# Patient Record
Sex: Female | Born: 1995 | Race: Black or African American | Hispanic: No | Marital: Married | Smoking: Never smoker
Health system: Southern US, Community
[De-identification: ages and names within clinical notes are randomized; demographics above are authoritative.]

## PROBLEM LIST (undated history)

## (undated) DIAGNOSIS — I10 Essential (primary) hypertension: Secondary | ICD-10-CM

---
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
## 2016-08-15 ENCOUNTER — Encounter (HOSPITAL_COMMUNITY): Payer: Self-pay

## 2016-08-15 ENCOUNTER — Emergency Department (HOSPITAL_COMMUNITY)
Admission: EM | Admit: 2016-08-15 | Discharge: 2016-08-15 | Disposition: A | Discharge status: Home / Self Care | Payer: Medicaid Other | Attending: Emergency Medicine | Admitting: Emergency Medicine

## 2016-08-15 ENCOUNTER — Emergency Department (HOSPITAL_COMMUNITY): Payer: Medicaid Other

## 2016-08-15 DIAGNOSIS — I10 Essential (primary) hypertension: Secondary | ICD-10-CM | POA: Insufficient documentation

## 2016-08-15 DIAGNOSIS — R0789 Other chest pain: Secondary | ICD-10-CM | POA: Diagnosis present

## 2016-08-15 LAB — BASIC METABOLIC PANEL
ANION GAP: 8 (ref 5–15)
BUN: 7 mg/dL (ref 6–20)
CHLORIDE: 102 mmol/L (ref 101–111)
CO2: 26 mmol/L (ref 22–32)
Calcium: 10.1 mg/dL (ref 8.9–10.3)
Creatinine, Ser: 0.9 mg/dL (ref 0.44–1.00)
GFR calc Af Amer: >60 mL/min (ref >60)
GLUCOSE: 91 mg/dL (ref 65–99)
POTASSIUM: 3.8 mmol/L (ref 3.5–5.1)
Sodium: 136 mmol/L (ref 135–145)

## 2016-08-15 LAB — I-STAT TROPONIN, ED
TROPONIN I, POC: 0 ng/mL (ref 0.00–0.08)
Troponin i, poc: 0 ng/mL (ref 0.00–0.08)

## 2016-08-15 LAB — CBC
HCT: 42.1 % (ref 36.0–46.0)
Hemoglobin: 13.9 g/dL (ref 12.0–15.0)
MCH: 28.2 pg (ref 26.0–34.0)
MCHC: 33 g/dL (ref 30.0–36.0)
MCV: 85.4 fL (ref 78.0–100.0)
PLATELETS: 486 10*3/uL — AB (ref 150–400)
RBC: 4.93 MIL/uL (ref 3.87–5.11)
RDW: 12.2 % (ref 11.5–15.5)
WBC: 5.9 10*3/uL (ref 4.0–10.5)

## 2016-08-15 LAB — D-DIMER, QUANTITATIVE: D-Dimer, Quant: 0.28 ug/mL-FEU (ref 0.00–0.50)

## 2016-08-15 LAB — I-STAT BETA HCG BLOOD, ED (MC, WL, AP ONLY): I-stat hCG, quantitative: <5 m[IU]/mL (ref <5)

## 2016-08-15 MED ORDER — IBUPROFEN 600 MG PO TABS: 600.0000 mg | ORAL_TABLET | Freq: Four times a day (QID) | ORAL | 0 refills | Status: AC | PRN

## 2016-08-15 MED ORDER — IBUPROFEN 800 MG PO TABS
800.0000 mg | ORAL_TABLET | Freq: Once | ORAL | Status: AC
  Administered 2016-08-15: 800 mg via ORAL
  Filled 2016-08-15: qty 1

## 2016-08-15 NOTE — Discharge Instructions (Signed)
Your workup today included an EKG, chest xray, and blood work does not show an emergent cause for your chest pain.  It is likely musculoskeletal.  You may take Ibuprofen every 6 hours as needed for pain.  [facility] physician [facility].  Return to the ED if you experience worsening chest pain, shortness of breath, pass out, or any new symptoms.

## 2016-08-15 NOTE — ED Triage Notes (Signed)
Per Pt, Pt is coming from home with new onset of chest discomfort that started this morning. Reports SOB. Denies any other symptoms

## 2016-08-15 NOTE — ED Provider Notes (Signed)
[facility] Provider Note   CSN: [id number] Arrival date & time: 08/15/16  4098     History   Chief Complaint Chief Complaint  Patient presents with  . Chest Pain    HPI Ana Blair is a 20 y.o. female.  HPI   Ana Blair is a 20 y.o. female with PMH significant for HTN on lisinopril who presents with sudden onset, constant, unchanging, central, aching, non-radiating chest pain that began 9 hours ago.  Associated symptoms include SOB.  The pain began when she was lying flat.  Worse when she lies on her right side and better when she lies on her stomach.  No medications prior to arrival.  It's not exertional.  She does not smoke.  No family hx of SCD.  No hx of DVT/PE.  No recent prolonged immobilization or surgery.  No exogenous estrogen use. This has never happened before. Denies recent dietary changes. She denies fever, chills, cough, upper back pain, lightheadedness, dizziness, N/V/D, abdominal pain, or urinary symptoms.  Past Medical History:  Diagnosis Date  . Hypertension     There are no active problems to display for this patient.   History reviewed. No pertinent surgical history.  OB History    No data available       Home Medications    Prior to Admission medications   Medication Sig Start Date End Date Taking? Authorizing Provider  amLODipine (NORVASC) 10 MG tablet Take 10 mg by mouth daily.    Historical Provider, MD  dextromethorphan (DELSYM) 30 MG/5ML liquid Take 10 mLs (60 mg total) by mouth as needed for cough. 05/16/13   Naida Sleight, PA-C  fexofenadine-pseudoephedrine (ALLEGRA-D) 60-120 MG per tablet Take 1 tablet by mouth every 12 (twelve) hours. 05/16/13   Naida Sleight, PA-C  fluticasone (FLONASE) 50 MCG/ACT nasal spray Place 2 sprays into the nose daily. 05/16/13   Naida Sleight, PA-C  ibuprofen (ADVIL,MOTRIN) 600 MG tablet Take 1 tablet (600 mg total) by mouth every 6 (six) hours as needed. 08/15/16   Cheri Fowler, PA-C    Family  History No family history on file.  Social History Social History  Substance Use Topics  . Smoking status: Never Smoker  . Smokeless tobacco: Never Used  . Alcohol use No     Allergies   Review of patient's allergies indicates no known allergies.   Review of Systems Review of Systems All other systems negative unless otherwise stated in HPI   Physical Exam Updated Vital Signs BP 136/78 (BP Location: Right Arm)   Pulse 79   Temp 98 F (36.7 C) (Oral)   Resp 14   LMP 08/02/2016   SpO2 99%   Physical Exam  Constitutional: She is oriented to person, place, and time. She appears well-developed and well-nourished.  Non-toxic appearance. She does not have a sickly appearance. She does not appear ill.  HENT:  Head: Normocephalic and atraumatic.  Mouth/Throat: Oropharynx is clear and moist.  Eyes: Conjunctivae are normal. Pupils are equal, round, and reactive to light.  Neck: Normal range of motion. Neck supple.  Cardiovascular: Normal rate and regular rhythm.   Pulmonary/Chest: Effort normal and breath sounds normal. No accessory muscle usage or stridor. No respiratory distress. She has no wheezes. She has no rhonchi. She has no rales. She exhibits tenderness.    She is not hypoxic, o2 sats 99% on RA.  Able to speak in clear full sentences without difficulty.   Abdominal: Soft. Bowel sounds are normal. She  exhibits no distension. There is no tenderness.  Musculoskeletal: Normal range of motion.  Lymphadenopathy:    She has no cervical adenopathy.  Neurological: She is alert and oriented to person, place, and time.  Speech clear without dysarthria.  Skin: Skin is warm and dry.  Psychiatric: She has a normal mood and affect. Her behavior is normal.     ED Treatments / Results  Labs (all labs ordered are listed, but only abnormal results are displayed) Labs Reviewed  CBC - Abnormal; Notable for the following:       Result Value   Platelets 486 (*)    All other  components within normal limits  BASIC METABOLIC PANEL  D-DIMER, QUANTITATIVE (NOT AT [facility])  I-STAT TROPOININ, ED  I-STAT BETA HCG BLOOD, ED ([facility], [facility], [facility] ONLY)  I-STAT TROPOININ, ED  GC/CHLAMYDIA PROBE AMP ([place]) NOT AT [facility]    EKG  EKG Interpretation  Date/Time:  Friday August 15 2016 13:28:03 EDT Ventricular Rate:  64 PR Interval:  128 QRS Duration: 85 QT Interval:  381 QTC Calculation: 393 R Axis:   67 Text Interpretation:  Sinus rhythm No significant change since last tracing Confirmed by [facility] MD, Barbara Cower [phone]) on 08/15/2016 1:37:10 PM       Radiology Dg Chest 2 View  Result Date: 08/15/2016 CLINICAL DATA:  Mid chest pain, shortness of breath EXAM: CHEST  2 VIEW COMPARISON:  None. FINDINGS: The heart size and mediastinal contours are within normal limits. Both lungs are clear. The visualized skeletal structures are unremarkable. IMPRESSION: No active cardiopulmonary disease. Electronically Signed   By: Jasmine Pang M.D.   On: 08/15/2016 10:16    Procedures Procedures (including critical care time)  Medications Ordered in ED Medications  ibuprofen (ADVIL,MOTRIN) tablet 800 mg (800 mg Oral Given 08/15/16 1121)     Initial Impression / Assessment and Plan / ED Course  I have reviewed the triage vital signs and the nursing notes.  Pertinent labs & imaging results that were available during my care of the patient were reviewed by me and considered in my medical decision making (see chart for details).  Clinical Course   Patient presents with sudden onset central non-radiating chest pain that began while lying in bed.  No family hx of SCD.  VSS, NAD.  Patient appears well, non-toxic, or ill.  Heart sounds normal, lungs clear, abdomen is soft and benign.   She is not pregnant.  Chest xray is normal.  EKG with nonspecific t wave abnormalities, no comparison.  HEART score 2, low risk. Low suspicion for ACS.  Delta troponin normal and repeat EKG NSR.  Consider  dissection, but less likely given history, vitals, and exam findings.  Consider PE with sudden onset CP and SOB, but no hypoxia or tachycardia, d-dimer normal.  Chest pain is reproducible with palpation, suspect musculoskeletal chest wall pain, received ibuprofen in ED.    Patient stable for discharge.  Follow up with PCP.  Return precautions discussed including worsening pain, shortness of breath, syncope, or any new symptoms.  Case has been discussed with Dr. Clayborne Dana who agrees with the above plan for discharge.     Final Clinical Impressions(s) / ED Diagnoses   Final diagnoses:  Chest wall pain    New Prescriptions New Prescriptions   IBUPROFEN (ADVIL,MOTRIN) 600 MG TABLET    Take 1 tablet (600 mg total) by mouth every 6 (six) hours as needed.     Cheri Fowler, PA-C 08/15/16 1430    Barbara Cower  Mesner, MD 08/15/16 1521

## 2016-09-01 ENCOUNTER — Emergency Department (HOSPITAL_COMMUNITY)
Admission: EM | Admit: 2016-09-01 | Discharge: 2016-09-01 | Disposition: A | Discharge status: Home / Self Care | Payer: Medicaid Other | Attending: Emergency Medicine | Admitting: Emergency Medicine

## 2016-09-01 ENCOUNTER — Emergency Department (HOSPITAL_COMMUNITY): Payer: Medicaid Other

## 2016-09-01 ENCOUNTER — Encounter (HOSPITAL_COMMUNITY): Payer: Self-pay

## 2016-09-01 DIAGNOSIS — R0789 Other chest pain: Secondary | ICD-10-CM | POA: Insufficient documentation

## 2016-09-01 DIAGNOSIS — I10 Essential (primary) hypertension: Secondary | ICD-10-CM | POA: Diagnosis not present

## 2016-09-01 DIAGNOSIS — R079 Chest pain, unspecified: Secondary | ICD-10-CM

## 2016-09-01 LAB — BASIC METABOLIC PANEL
Anion gap: 8 (ref 5–15)
BUN: 7 mg/dL (ref 6–20)
CHLORIDE: 103 mmol/L (ref 101–111)
CO2: 26 mmol/L (ref 22–32)
CREATININE: 0.9 mg/dL (ref 0.44–1.00)
Calcium: 9.7 mg/dL (ref 8.9–10.3)
GFR calc non Af Amer: >60 mL/min (ref >60)
Glucose, Bld: 94 mg/dL (ref 65–99)
POTASSIUM: 3.7 mmol/L (ref 3.5–5.1)
Sodium: 137 mmol/L (ref 135–145)

## 2016-09-01 LAB — CBC
HEMATOCRIT: 40.4 % (ref 36.0–46.0)
Hemoglobin: 13 g/dL (ref 12.0–15.0)
MCH: 27.7 pg (ref 26.0–34.0)
MCHC: 32.2 g/dL (ref 30.0–36.0)
MCV: 86.1 fL (ref 78.0–100.0)
PLATELETS: 428 10*3/uL — AB (ref 150–400)
RBC: 4.69 MIL/uL (ref 3.87–5.11)
RDW: 12.4 % (ref 11.5–15.5)
WBC: 4.4 10*3/uL (ref 4.0–10.5)

## 2016-09-01 LAB — I-STAT TROPONIN, ED: Troponin i, poc: 0 ng/mL (ref 0.00–0.08)

## 2016-09-01 MED ORDER — OMEPRAZOLE 20 MG PO CPDR: 20.0000 mg | DELAYED_RELEASE_CAPSULE | Freq: Every day | ORAL | 0 refills | Status: AC

## 2016-09-01 MED ORDER — GI COCKTAIL ~~LOC~~
30.0000 mL | Freq: Once | ORAL | Status: AC
  Administered 2016-09-01: 30 mL via ORAL
  Filled 2016-09-01: qty 30

## 2016-09-01 NOTE — Discharge Instructions (Signed)
Your labs and exam were reassuring. I suspect you might have some acid reflux contributing to your chest pain. Take the omeprazole as prescribed and follow up with your primary care provider this week. [facility]. Return to the ER for new or worsening symptoms.

## 2016-09-01 NOTE — ED Provider Notes (Signed)
[facility]-EMERGENCY DEPT Provider Note   CSN: [id number] Arrival date & time: 09/01/16  0909  History   Chief Complaint Chief Complaint  Patient presents with  . Chest Pain    HPI  Ana Blair is an 20 y.o. female with history of HTN who presents to the ED for evaluation of chest pain. She states she was initially evaluated in the ED for similar chest pain on 9/15 with negative workup and diagnosed with chest wall pain. She states she has been taking motrin which provides relief but the pain returns after the motrin wears off. Now for the past three days she has had constant centralized chest pain that waxes and wanes in severity. She states the pain is often worse after she eats and if she lays down. She is not sure if she has had a history of reflux or indigestion. States she sometimes feels minimally short of breath. States the pain is not worse with exertion. She denies cigarette use. Denies Fam hx of CAD or heart dz. Denies recent travel. Denies leg pain or swelling. Denies abdominal pain, n/v/d.   Past Medical History:  Diagnosis Date  . Hypertension     There are no active problems to display for this patient.   History reviewed. No pertinent surgical history.  OB History    No data available       Home Medications    Prior to Admission medications   Medication Sig Start Date End Date Taking? Authorizing Provider  amLODipine (NORVASC) 10 MG tablet Take 10 mg by mouth daily.    Historical Provider, MD  dextromethorphan (DELSYM) 30 MG/5ML liquid Take 10 mLs (60 mg total) by mouth as needed for cough. 05/16/13   Naida SleightJames M Owens, PA-C  fexofenadine-pseudoephedrine (ALLEGRA-D) 60-120 MG per tablet Take 1 tablet by mouth every 12 (twelve) hours. 05/16/13   Naida SleightJames M Owens, PA-C  fluticasone (FLONASE) 50 MCG/ACT nasal spray Place 2 sprays into the nose daily. 05/16/13   Naida SleightJames M Owens, PA-C  ibuprofen (ADVIL,MOTRIN) 600 MG tablet Take 1 tablet (600 mg total) by mouth every 6 (six)  hours as needed. 08/15/16   Cheri FowlerKayla Rose, PA-C  omeprazole (PRILOSEC) 20 MG capsule Take 1 capsule (20 mg total) by mouth daily. 09/01/16   Carlene CoriaSerena Y Chrystel Barefield, PA-C    Family History No family history on file.  Social History Social History  Substance Use Topics  . Smoking status: Never Smoker  . Smokeless tobacco: Never Used  . Alcohol use No     Allergies   Review of patient's allergies indicates no known allergies.   Review of Systems Review of Systems 10 Systems reviewed and are negative for acute change except as noted in the HPI.   Physical Exam Updated Vital Signs BP 118/79 (BP Location: Left Arm)   Pulse 71   Temp 98.3 F (36.8 C) (Oral)   Resp 20   Ht 5' (1.524 m)   Wt 81.2 kg   LMP 08/30/2016 (Exact Date)   SpO2 100%   BMI 34.96 kg/m   Physical Exam  Constitutional: She is oriented to person, place, and time.  HENT:  Right Ear: External ear normal.  Left Ear: External ear normal.  Nose: Nose normal.  Mouth/Throat: Oropharynx is clear and moist. No oropharyngeal exudate.  Eyes: Conjunctivae are normal.  Neck: Neck supple.  Cardiovascular: Normal rate, regular rhythm, normal heart sounds and intact distal pulses.   Pulmonary/Chest: Effort normal and breath sounds normal. No respiratory distress. She has no  wheezes.  Abdominal: Soft. Bowel sounds are normal. She exhibits no distension. There is no tenderness. There is no rebound and no guarding.  Musculoskeletal: She exhibits no edema.  Lymphadenopathy:    She has no cervical adenopathy.  Neurological: She is alert and oriented to person, place, and time. No cranial nerve deficit.  Skin: Skin is warm and dry.  Psychiatric: She has a normal mood and affect.  Nursing note and vitals reviewed.    ED Treatments / Results  Labs (all labs ordered are listed, but only abnormal results are displayed) Labs Reviewed  CBC - Abnormal; Notable for the following:       Result Value   Platelets 428 (*)    All other  components within normal limits  BASIC METABOLIC PANEL  I-STAT TROPOININ, ED    EKG  EKG Interpretation None       Radiology Dg Chest 2 View  Result Date: 09/01/2016 CLINICAL DATA:  Mid chest pain, onset 3 days ago. Pain is worse after eating or sitting. EXAM: CHEST  2 VIEW COMPARISON:  08/15/2016. FINDINGS: Trachea is midline. Heart size normal. Lungs are clear. No pleural fluid. IMPRESSION: Negative. Electronically Signed   By: Leanna Battles M.D.   On: 09/01/2016 10:33    Procedures Procedures (including critical care time)  Medications Ordered in ED Medications  gi cocktail (Maalox,Lidocaine,Donnatal) (30 mLs Oral Given 09/01/16 1208)     Initial Impression / Assessment and Plan / ED Course  I have reviewed the triage vital signs and the nursing notes.  Pertinent labs & imaging results that were available during my care of the patient were reviewed by me and considered in my medical decision making (see chart for details).  Clinical Course   Pt presenting with chest pain that started a couple weeks ago, now returned and constant for the past three days. HEART score 2. Doubt ACS. PERC 0 and low suspicion for PE. Pain is worse after eating and when laying down. Question if there is some reflux playing a role in pt's chest pain. Her EKG is nonacute and CXR negative today. Labs unrevealing. Will trial course of PPI at home with instructions for close PCP follow up. With her history of HTN and nonspecific EKG changes I discussed with pt it would be reasonable to see a cardiologist in the near future for evaluation. Strict ER return precautions given.  Final Clinical Impressions(s) / ED Diagnoses   Final diagnoses:  Nonspecific chest pain    New Prescriptions New Prescriptions   OMEPRAZOLE (PRILOSEC) 20 MG CAPSULE    Take 1 capsule (20 mg total) by mouth daily.     Carlene Coria, PA-C 09/01/16 1235    Pricilla Loveless, MD 09/01/16 [phone]

## 2016-09-01 NOTE — ED Triage Notes (Signed)
Per pT, Pt is coming from home with complaints of chest pain that started on Friday. Reports happening while she is sitting of after she eats. Denies SOB, N/V/D.

## 2016-09-01 NOTE — ED Notes (Signed)
Patient states she was seen in the ED several weeks ago for same and was told to take Motrin, states she feels better however when it wears off the pain returns. C/o sob at times. Denies cough.

## 2017-09-03 IMAGING — CR DG CHEST 2V
2 series · 2 of 2 positions shown · non-contrast
Comparison: None.

CLINICAL DATA: Mid chest pain, shortness of breath

EXAM:
CHEST  2 VIEW

[chest pa]
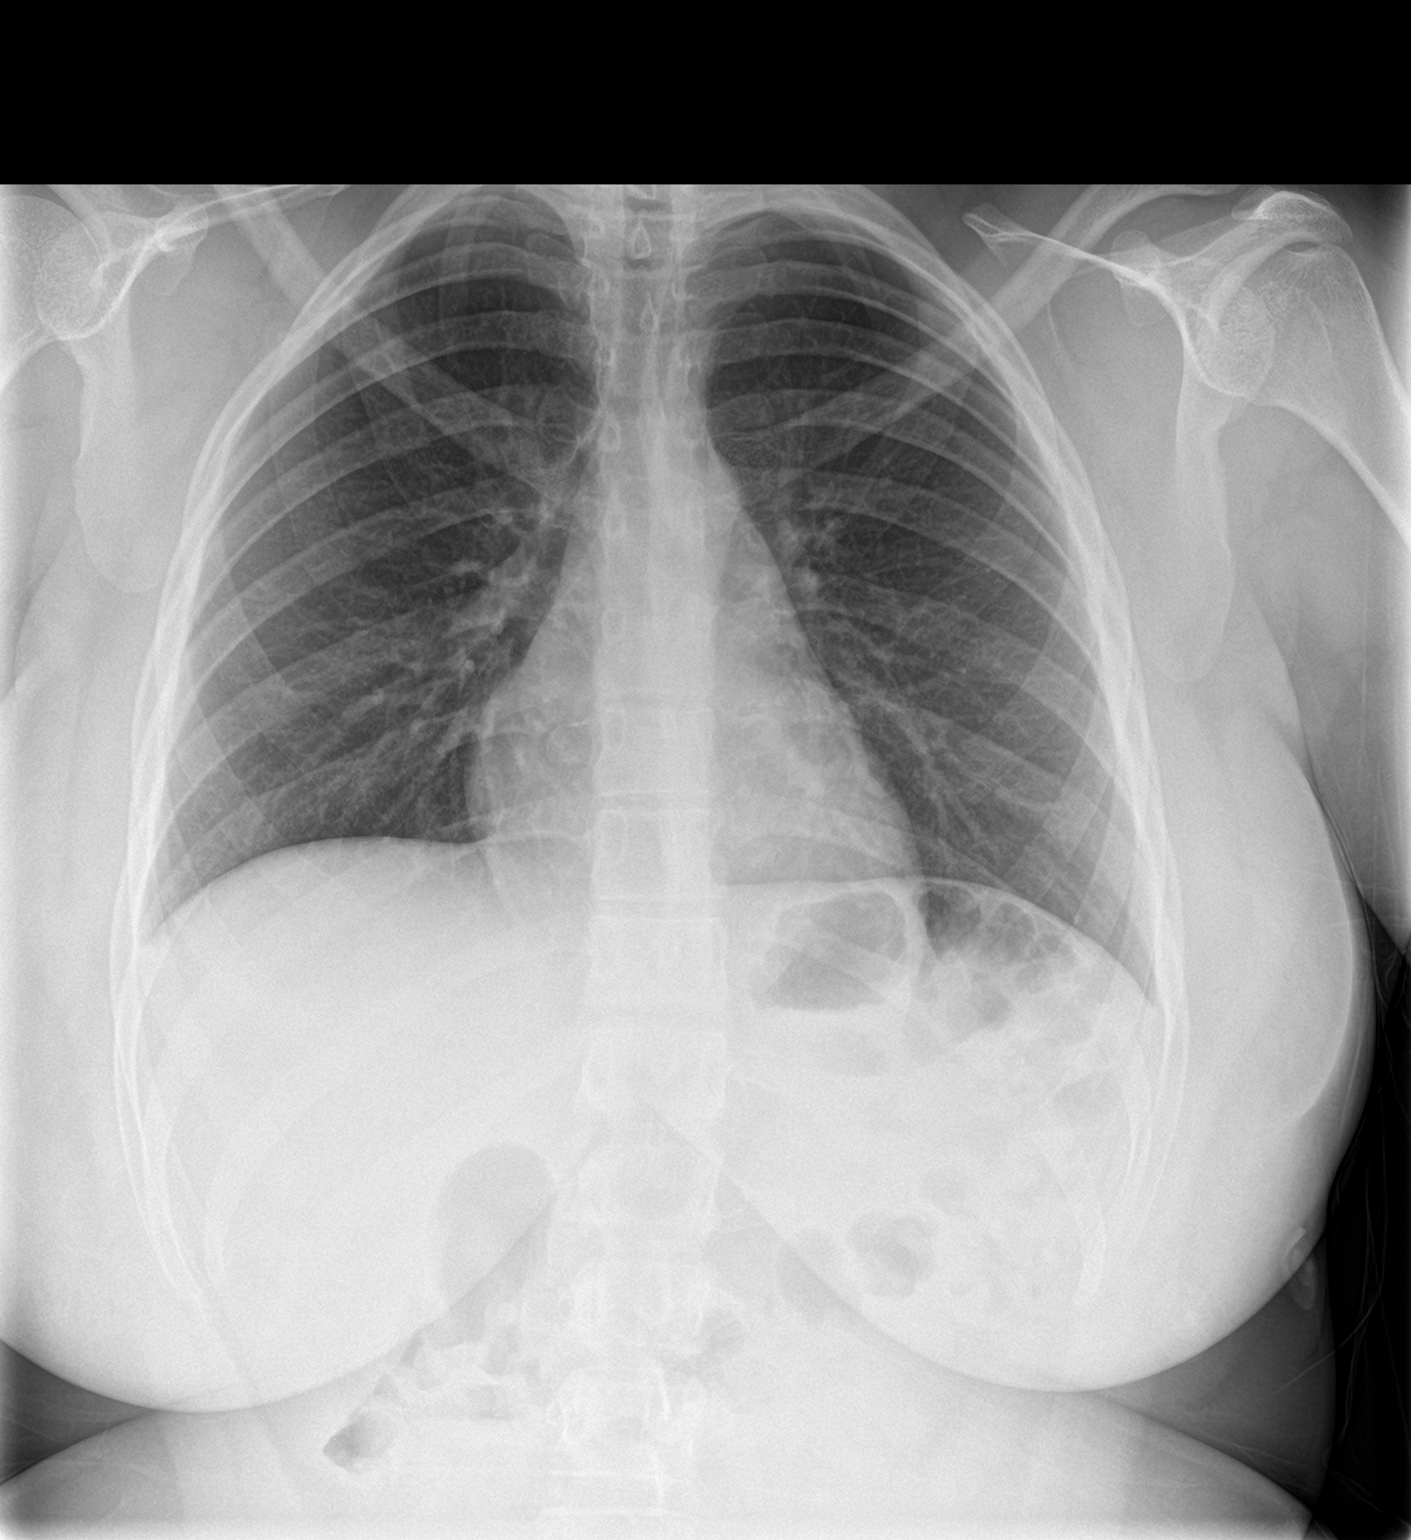

[chest lat]
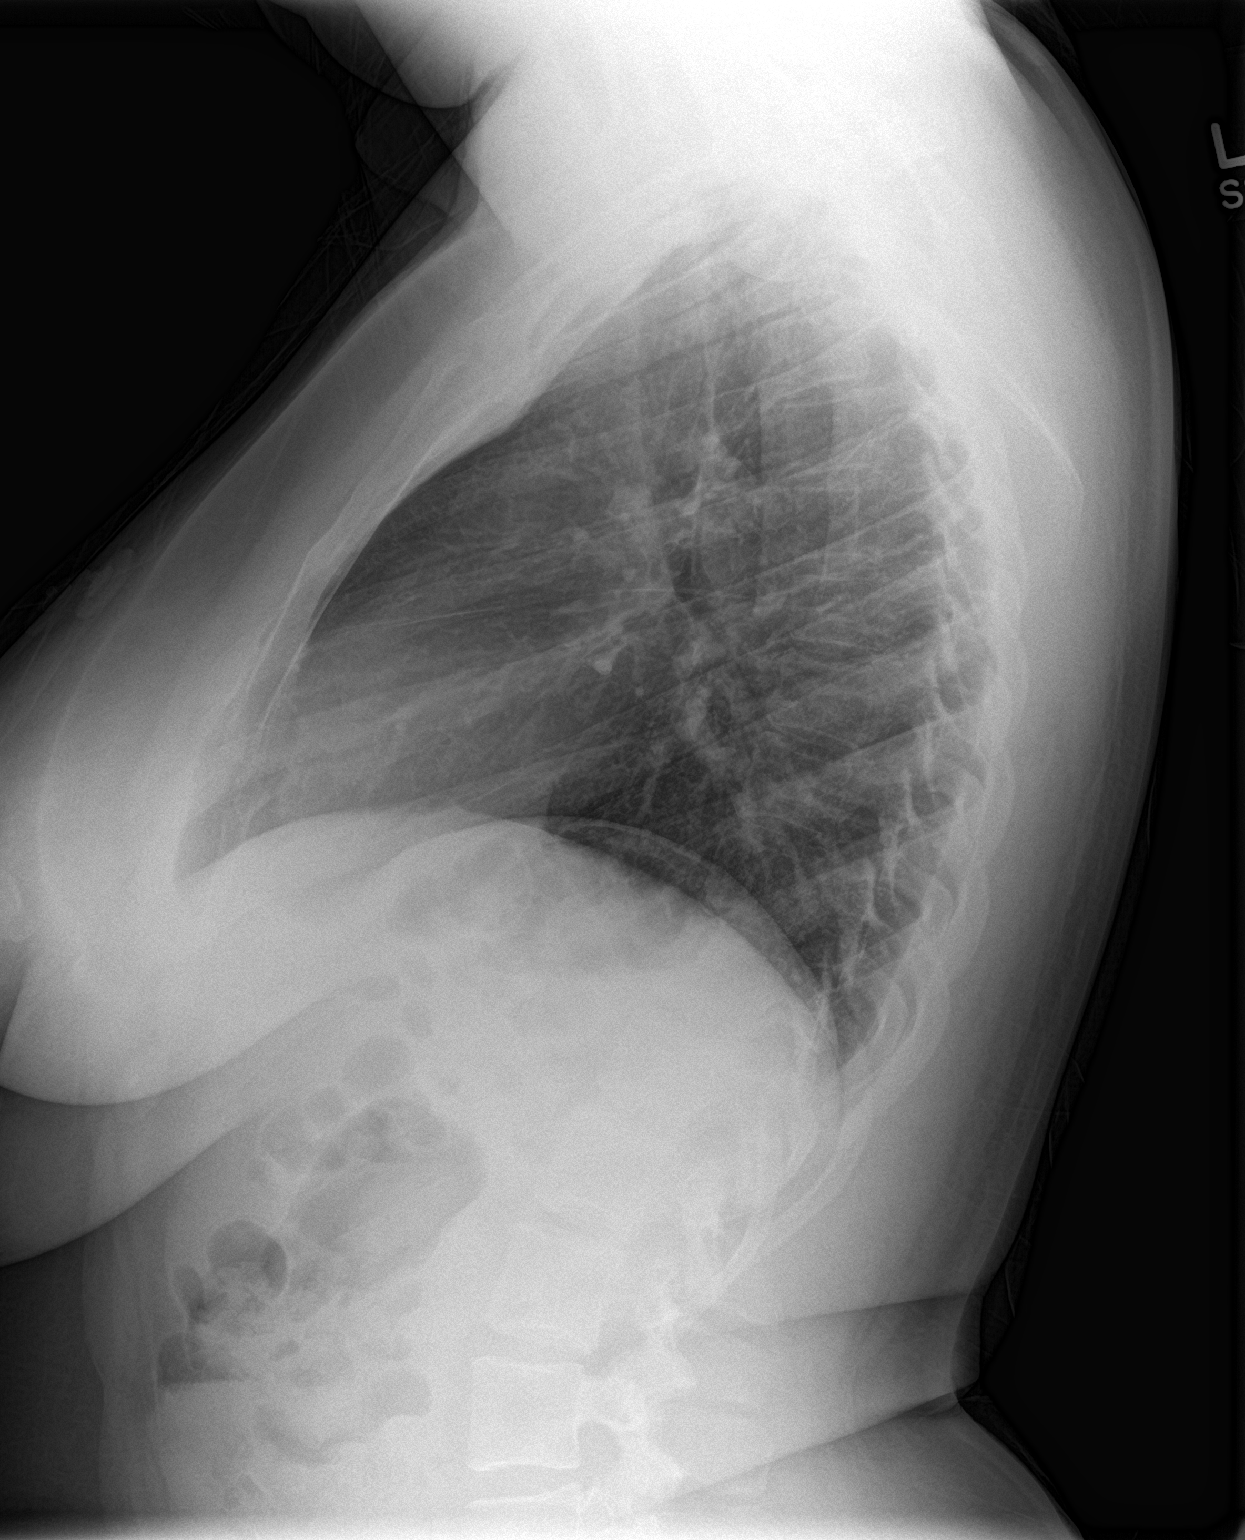

[2 of 2 positions shown; findings below may reference images not displayed]

FINDINGS: The heart size and mediastinal contours are within normal limits.
Both lungs are clear. The visualized skeletal structures are
unremarkable.
IMPRESSION: No active cardiopulmonary disease.

## 2017-09-20 IMAGING — DX DG CHEST 2V
2 series · 2 of 2 positions shown · non-contrast
Comparison: 08/15/2016.

CLINICAL DATA: Mid chest pain, onset 3 days ago. Pain is worse
after eating or sitting.

EXAM:
CHEST  2 VIEW

[w chest pa]
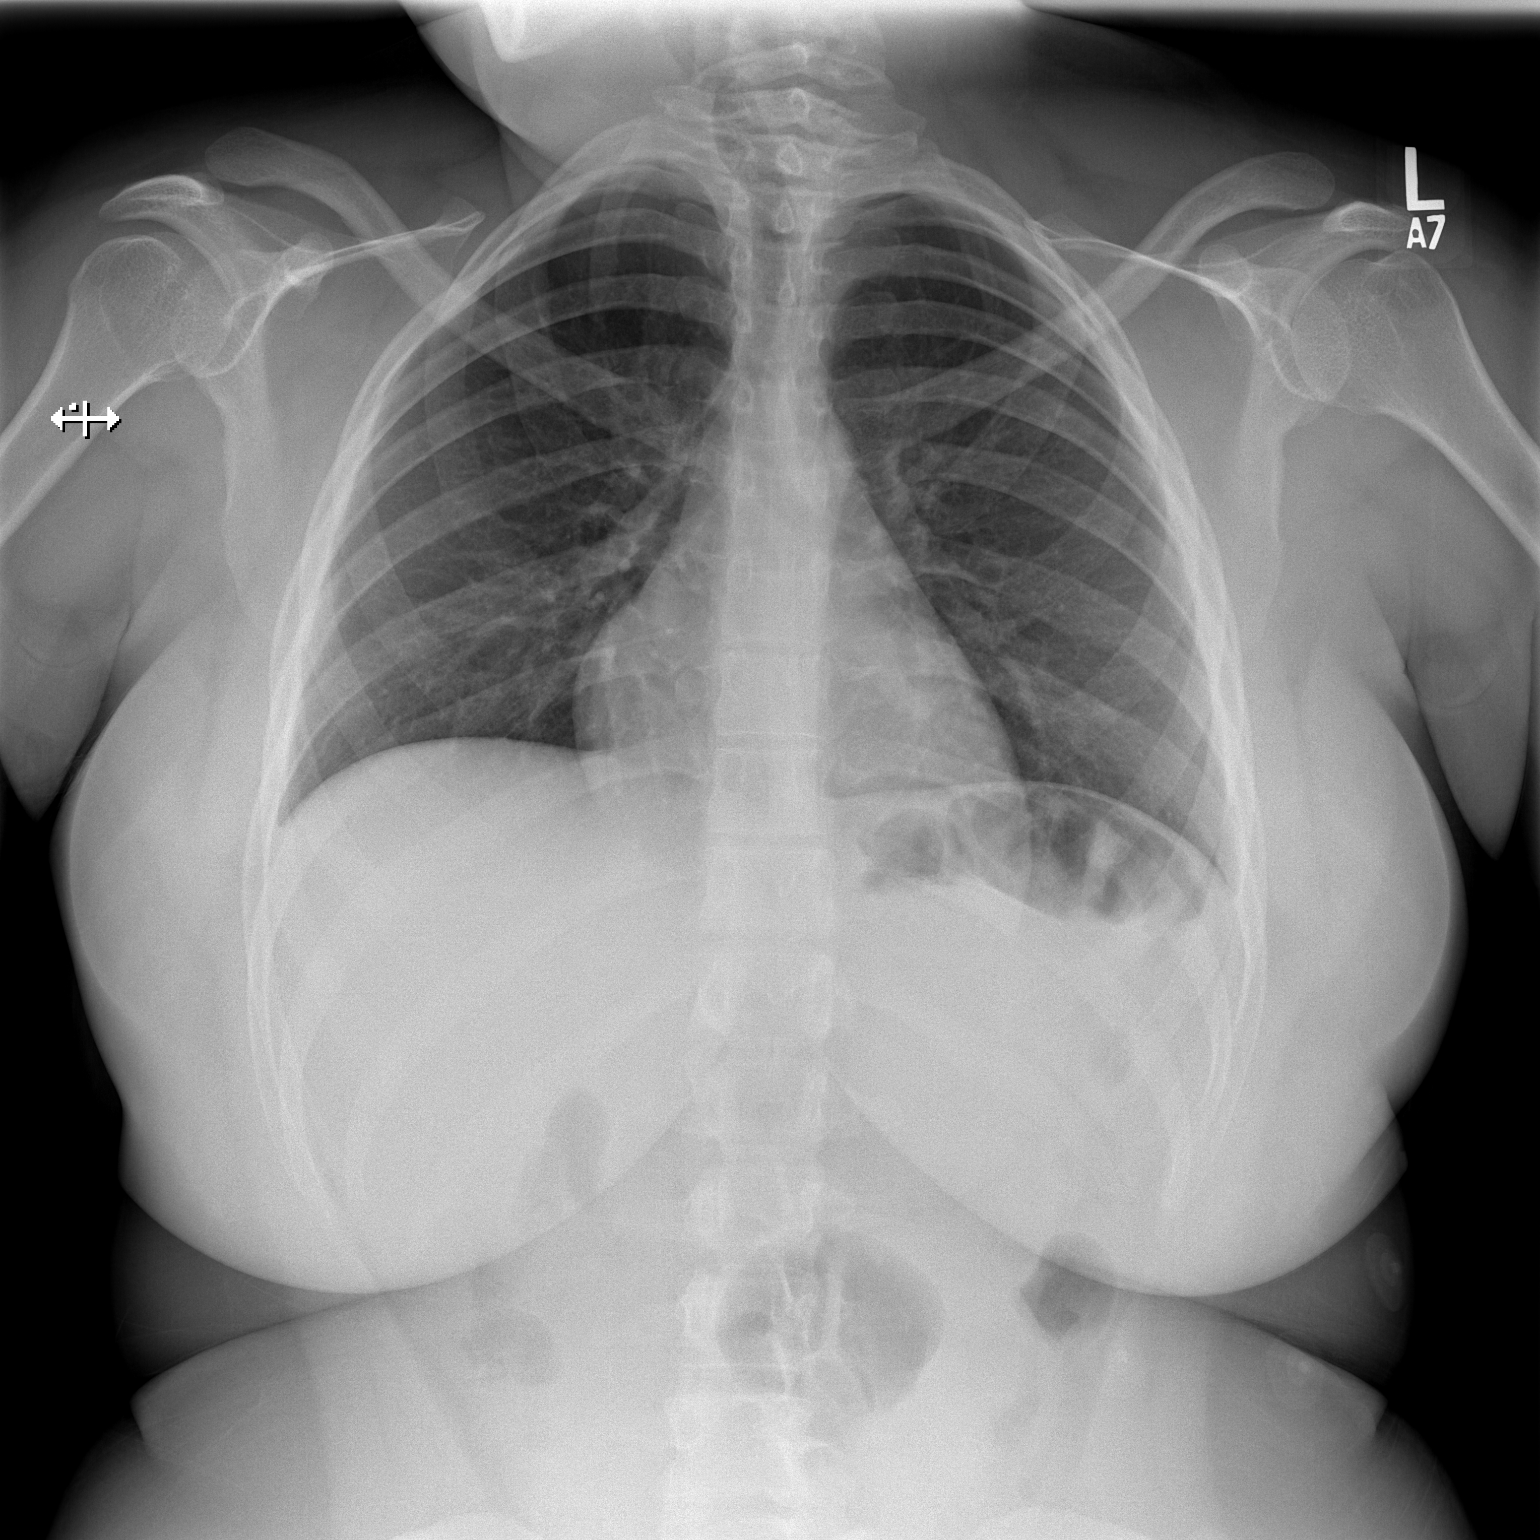

[w chest lat]
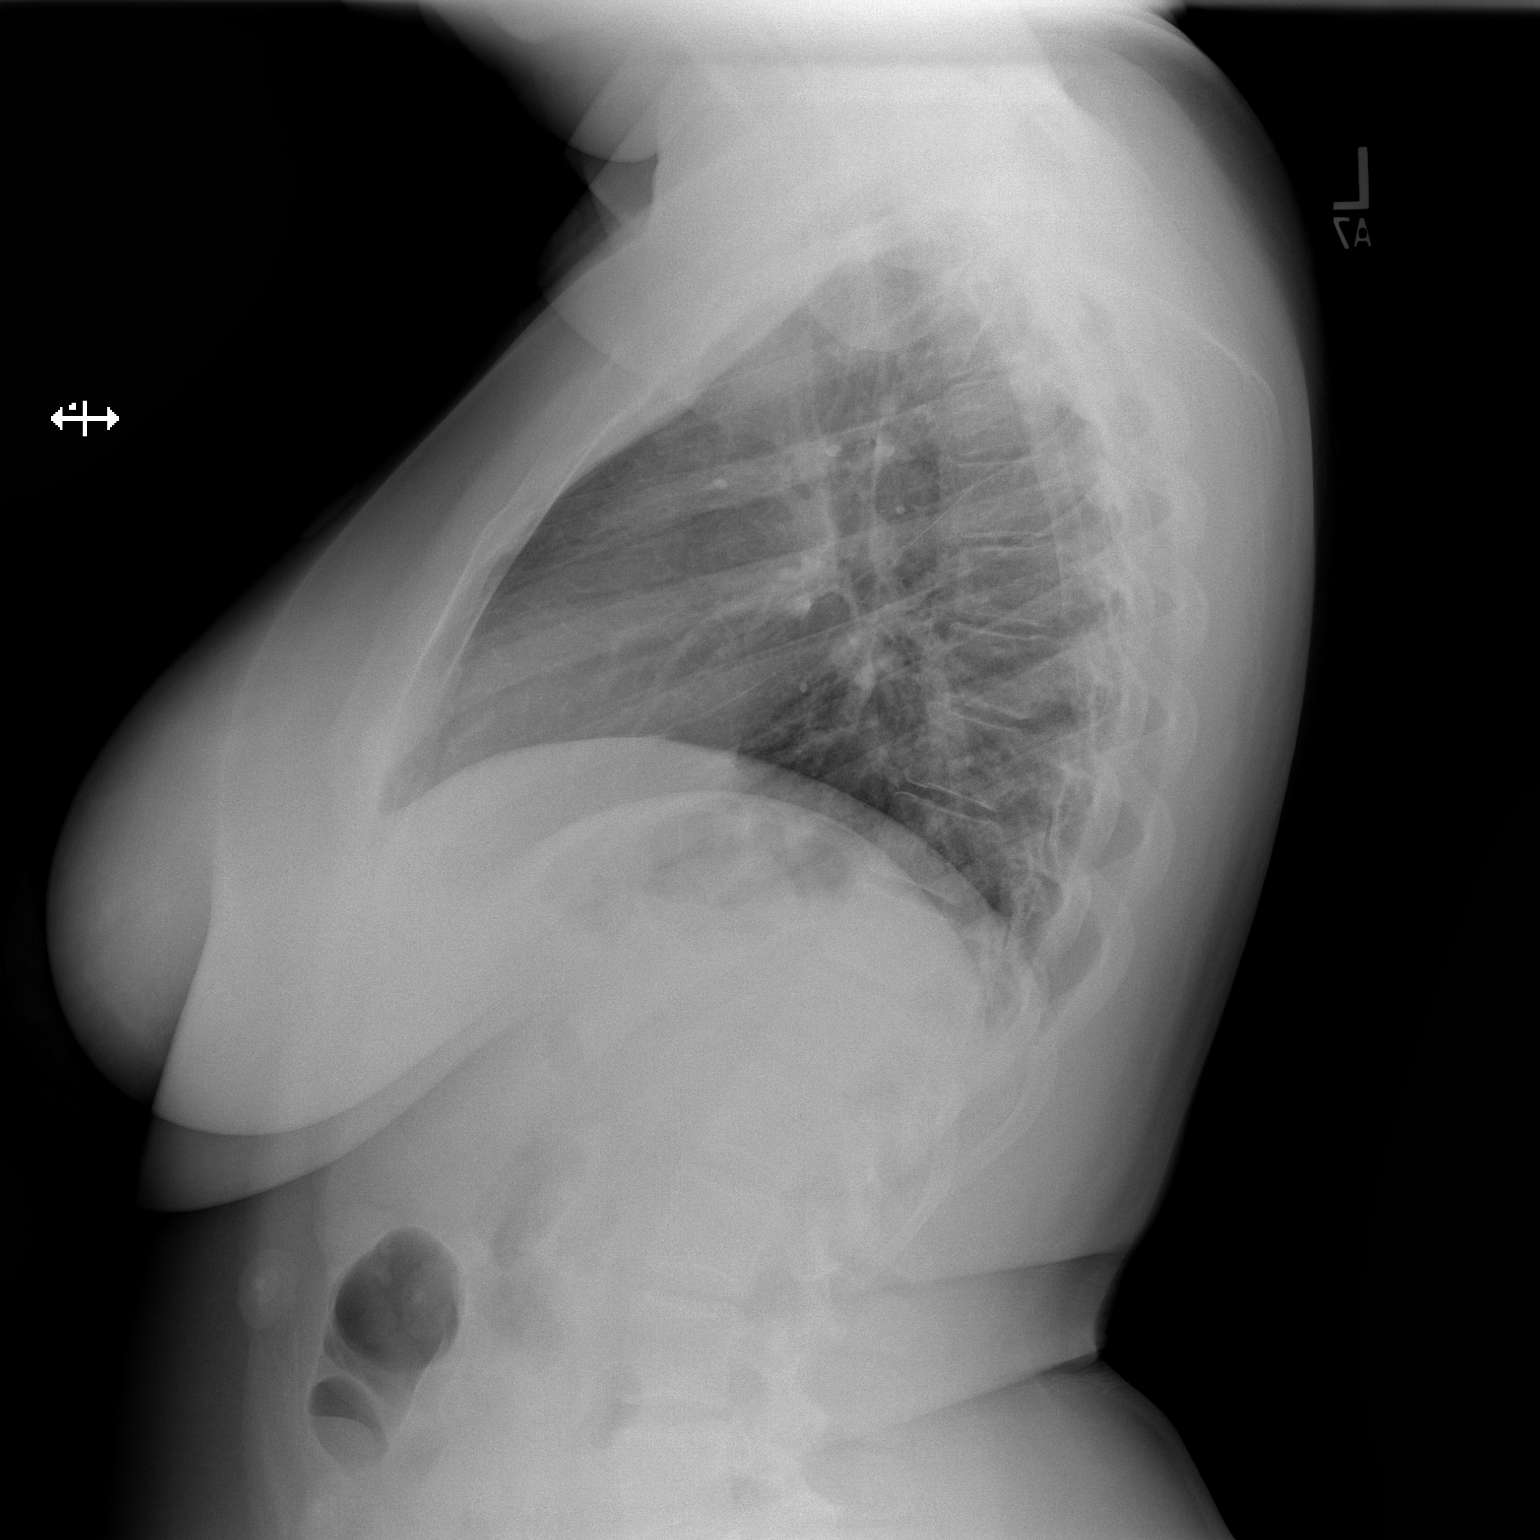

[2 of 2 positions shown; findings below may reference images not displayed]

FINDINGS: Trachea is midline. Heart size normal. Lungs are clear. No pleural
fluid.
IMPRESSION: Negative.
